# Patient Record
Sex: Male | Born: 1965 | Race: White | Hispanic: No | Marital: Married | Smoking: Never smoker
Health system: Southern US, Community
[De-identification: ages and names within clinical notes are randomized; demographics above are authoritative.]

## PROBLEM LIST (undated history)

## (undated) DIAGNOSIS — I10 Essential (primary) hypertension: Secondary | ICD-10-CM

## (undated) DIAGNOSIS — E7800 Pure hypercholesterolemia, unspecified: Secondary | ICD-10-CM

## (undated) DIAGNOSIS — M353 Polymyalgia rheumatica: Secondary | ICD-10-CM

## (undated) HISTORY — PX: SHOULDER SURGERY: SHX246

## (undated) HISTORY — DX: Polymyalgia rheumatica: M35.3

---

## 2015-09-17 ENCOUNTER — Emergency Department (HOSPITAL_BASED_OUTPATIENT_CLINIC_OR_DEPARTMENT_OTHER)
Admission: EM | Admit: 2015-09-17 | Discharge: 2015-09-17 | Disposition: A | Discharge status: Home / Self Care | Payer: No Typology Code available for payment source | Attending: Emergency Medicine | Admitting: Emergency Medicine

## 2015-09-17 ENCOUNTER — Encounter (HOSPITAL_BASED_OUTPATIENT_CLINIC_OR_DEPARTMENT_OTHER): Payer: Self-pay

## 2015-09-17 DIAGNOSIS — R1110 Vomiting, unspecified: Secondary | ICD-10-CM | POA: Diagnosis present

## 2015-09-17 DIAGNOSIS — Z79899 Other long term (current) drug therapy: Secondary | ICD-10-CM | POA: Insufficient documentation

## 2015-09-17 DIAGNOSIS — K529 Noninfective gastroenteritis and colitis, unspecified: Secondary | ICD-10-CM | POA: Insufficient documentation

## 2015-09-17 DIAGNOSIS — I10 Essential (primary) hypertension: Secondary | ICD-10-CM | POA: Insufficient documentation

## 2015-09-17 DIAGNOSIS — E7800 Pure hypercholesterolemia, unspecified: Secondary | ICD-10-CM | POA: Insufficient documentation

## 2015-09-17 HISTORY — DX: Essential (primary) hypertension: I10

## 2015-09-17 HISTORY — DX: Pure hypercholesterolemia, unspecified: E78.00

## 2015-09-17 LAB — CBC WITH DIFFERENTIAL/PLATELET
BASOS ABS: 0 10*3/uL (ref 0.0–0.1)
BASOS PCT: 0 %
EOS ABS: 0.1 10*3/uL (ref 0.0–0.7)
Eosinophils Relative: 1 %
HCT: 46.5 % (ref 39.0–52.0)
HEMOGLOBIN: 16.6 g/dL (ref 13.0–17.0)
Lymphocytes Relative: 7 %
Lymphs Abs: 0.8 10*3/uL (ref 0.7–4.0)
MCH: 30.2 pg (ref 26.0–34.0)
MCHC: 35.7 g/dL (ref 30.0–36.0)
MCV: 84.7 fL (ref 78.0–100.0)
MONOS PCT: 5 %
Monocytes Absolute: 0.5 10*3/uL (ref 0.1–1.0)
NEUTROS PCT: 87 %
Neutro Abs: 9.4 10*3/uL — ABNORMAL HIGH (ref 1.7–7.7)
Platelets: 290 10*3/uL (ref 150–400)
RBC: 5.49 MIL/uL (ref 4.22–5.81)
RDW: 12.1 % (ref 11.5–15.5)
WBC: 10.7 10*3/uL — AB (ref 4.0–10.5)

## 2015-09-17 LAB — BASIC METABOLIC PANEL
ANION GAP: 11 (ref 5–15)
BUN: 25 mg/dL — ABNORMAL HIGH (ref 6–20)
CALCIUM: 9.4 mg/dL (ref 8.9–10.3)
CO2: 26 mmol/L (ref 22–32)
CREATININE: 1.16 mg/dL (ref 0.61–1.24)
Chloride: 100 mmol/L — ABNORMAL LOW (ref 101–111)
Glucose, Bld: 120 mg/dL — ABNORMAL HIGH (ref 65–99)
Potassium: 3.7 mmol/L (ref 3.5–5.1)
SODIUM: 137 mmol/L (ref 135–145)

## 2015-09-17 MED ORDER — ONDANSETRON HCL 4 MG/2ML IJ SOLN
4.0000 mg | Freq: Once | INTRAMUSCULAR | Status: AC
  Administered 2015-09-17: 4 mg via INTRAVENOUS

## 2015-09-17 MED ORDER — ONDANSETRON 4 MG PO TBDP: 4.0000 mg | ORAL_TABLET | Freq: Three times a day (TID) | ORAL | Status: AC | PRN

## 2015-09-17 MED ORDER — SODIUM CHLORIDE 0.9 % IV BOLUS (SEPSIS)
1000.0000 mL | Freq: Once | INTRAVENOUS | Status: AC
  Administered 2015-09-17: 1000 mL via INTRAVENOUS

## 2015-09-17 MED ORDER — ONDANSETRON HCL 4 MG/2ML IJ SOLN
INTRAMUSCULAR | Status: AC
  Filled 2015-09-17: qty 2

## 2015-09-17 MED ORDER — ONDANSETRON HCL 4 MG/2ML IJ SOLN
4.0000 mg | Freq: Once | INTRAMUSCULAR | Status: AC
  Administered 2015-09-17: 4 mg via INTRAVENOUS
  Filled 2015-09-17: qty 2

## 2015-09-17 NOTE — ED Notes (Signed)
Pt states vomiting and diarrhea after eating at the [employer]; his girlfriend is a patient also with same c/o

## 2015-09-17 NOTE — ED Notes (Signed)
Pt c/o n/v/d after eating last pm

## 2015-09-17 NOTE — ED Provider Notes (Signed)
CSN: [id number]     Arrival date & time 09/17/15  0048 History   First MD Initiated Contact with Patient 09/17/15 0210     Chief Complaint  Patient presents with  . Emesis     (Consider location/radiation/quality/duration/timing/severity/associated sxs/prior Treatment) HPI Comments: 49yo M w/ HTN and HLD who p/w vomiting and diarrhea. Just prior to arrival, the patient and his girlfriend had a sudden onset of vomiting and non-bloody diarrhea. They both ate at a restaurant earlier tonight and shared shrimp salad. He has had crampy abdominal pain but no severe abdominal pain currently. No fevers. No cough/cold symptoms or recent illness. No sick contacts except for his girlfriend.  Patient is a 49 y.o. male presenting with vomiting. The history is provided by the patient.  Emesis   Past Medical History  Diagnosis Date  . Hypertension   . High cholesterol    History reviewed. No pertinent past surgical history. No family history on file. Social History  Substance Use Topics  . Smoking status: Never Smoker   . Smokeless tobacco: None  . Alcohol Use: Yes     Comment: social    Review of Systems  Gastrointestinal: Positive for vomiting.    10 Systems reviewed and are negative for acute change except as noted in the HPI.   Allergies  Review of patient's allergies indicates no known allergies.  Home Medications   Prior to Admission medications   Medication Sig Start Date End Date Taking? Authorizing Provider  hydrochlorothiazide (HYDRODIURIL) 12.5 MG tablet Take 12.5 mg by mouth daily.   Yes Historical Provider, MD  simvastatin (ZOCOR) 20 MG tablet Take 20 mg by mouth daily.   Yes Historical Provider, MD  ondansetron (ZOFRAN ODT) 4 MG disintegrating tablet Take 1 tablet (4 mg total) by mouth every 8 (eight) hours as needed for nausea or vomiting. 09/17/15   Ambrose Finlandachel Morgan Little, MD   BP 119/77 mmHg  Pulse 114  Temp(Src) 99.1 F (37.3 C) (Oral)  Resp 18  SpO2 98% Physical  Exam  Constitutional: He is oriented to person, place, and time. He appears well-developed and well-nourished. No distress.  Uncomfortable, holding emesis bag  HENT:  Head: Normocephalic and atraumatic.  Moist mucous membranes  Eyes: Conjunctivae are normal. Pupils are equal, round, and reactive to light.  Neck: Neck supple.  Cardiovascular: Normal rate, regular rhythm and normal heart sounds.   No murmur heard. Pulmonary/Chest: Effort normal and breath sounds normal.  Abdominal: Soft. Bowel sounds are normal. He exhibits no distension. There is no tenderness.  Musculoskeletal: He exhibits no edema.  Neurological: He is alert and oriented to person, place, and time.  Fluent speech  Skin: Skin is warm and dry.  Psychiatric: He has a normal mood and affect. Judgment normal.  Nursing note and vitals reviewed.   ED Course  Procedures (including critical care time) Labs Review Labs Reviewed  BASIC METABOLIC PANEL - Abnormal; Notable for the following:    Chloride 100 (*)    Glucose, Bld 120 (*)    BUN 25 (*)    All other components within normal limits  CBC WITH DIFFERENTIAL/PLATELET - Abnormal; Notable for the following:    WBC 10.7 (*)    Neutro Abs 9.4 (*)    All other components within normal limits    Imaging Review No results found. I have personally reviewed and evaluated these lab results as part of my medical decision-making.   EKG Interpretation None     Medications  sodium chloride  0.9 % bolus 1,000 mL (0 mLs Intravenous Stopped 09/17/15 0230)  ondansetron (ZOFRAN) injection 4 mg (4 mg Intravenous Given 09/17/15 0055)  sodium chloride 0.9 % bolus 1,000 mL (0 mLs Intravenous Stopped 09/17/15 0340)  ondansetron (ZOFRAN) injection 4 mg (4 mg Intravenous Given 09/17/15 0229)    MDM   Final diagnoses:  Gastroenteritis   49 year old male who presents with sudden onset of vomiting and diarrhea tonight. His girlfriend was brought in for the same symptoms. Patient  uncomfortable but nontoxic in appearance. No abdominal tenderness on exam. Vital signs stable. Basic labs showed WBC 10.7, otherwise unremarkable. Gave the patient an IV fluid bolus 2 and Zofran after which he felt much improved. Reviewed supportive care instructions and provided with Zofran to use as needed at home. The patient denies any abdominal pain and given that his symptoms are improved, I do not feel that he needs any abdominal imaging at this time. Return precautions reviewed and patient discharged in satisfactory condition.  Laurence Spates, MD 09/17/15 [phone]

## 2015-10-28 NOTE — ED Notes (Signed)
Patient called stating that he needs from the date of service stating that he and his girlfriend were seen in the emergency department for a viral gastroenteritis after eating in a local restaurant, and would not be able to travel for three or four days after being treated.  Chart reviewed with Dr. Felix PaciniJacobuwitz and ok given to write note.

## 2019-01-11 ENCOUNTER — Encounter (INDEPENDENT_AMBULATORY_CARE_PROVIDER_SITE_OTHER): Payer: Self-pay

## 2019-01-11 ENCOUNTER — Encounter (INDEPENDENT_AMBULATORY_CARE_PROVIDER_SITE_OTHER): Payer: Self-pay | Admitting: Orthopaedic Surgery

## 2019-01-11 ENCOUNTER — Ambulatory Visit (INDEPENDENT_AMBULATORY_CARE_PROVIDER_SITE_OTHER): Payer: Self-pay | Admitting: Orthopaedic Surgery

## 2019-01-11 VITALS — BP 146/106 | HR 61 | Ht 66.0 in | Wt 165.0 lb

## 2019-01-11 DIAGNOSIS — M25512 Pain in left shoulder: Secondary | ICD-10-CM

## 2019-01-11 NOTE — Progress Notes (Signed)
Office Visit Note   Patient: Kyle Mckinney           Date of Birth: 10/05/1966           MRN: [id number] Visit Date: 01/11/2019              Requested by: No referring provider defined for this encounter. PCP: System, Pcp Not In   Assessment & Plan: Visit Diagnoses:  1. Acute pain of left shoulder     Plan: Recent onset of left shoulder pain over the last several weeks he thinks probably related to playing pickle ball.  He has developed some ecchymosis in the mid arm in the area of the biceps.  Appearance appears to be consistent with a rupture of the long head of the biceps tendon.  Is not having any impingement.  Hopefully this is just an isolated rupture of the biceps tendon and will slowly resolve over time.  If no improvement over the next 3 to 4 weeks would suggest an MRI scan.  Activity modification until he is feeling better. Also has been experiencing some pain of his Achilles tendons when he runs as he does up to 3 times a week.  No obvious change by exam.  Have discussed cross training and good comfortable shoes  Follow-Up Instructions: Return if symptoms worsen or fail to improve.   Orders:  No orders of the defined types were placed in this encounter.  No orders of the defined types were placed in this encounter.     Procedures: No procedures performed   Clinical Data: No additional findings.   Subjective: Chief Complaint  Patient presents with  . Left Shoulder - Pain  Patient presents today with left shoulder pain X 28months. No known injury. Patient states that he has pain that radiates into his biceps. He has a large knot and bruise on his biceps area. He is taking prednisone for PMR, which he was diagnosed with at the same time the pain started. He has had no previous x-rays. Kyle Mckinney experienced rather cute onset of left dominant shoulder pain within the last several weeks he believes after playing pickle ball.  Several days he developed some ecchymosis in  the mid arm associated with change in the appearance of the biceps tendon.  He does have a history of polymyalgia rheumatica and is presently taking 30 mg of prednisone daily  HPI  Review of Systems   Objective: Vital Signs: BP (!) 146/106   Pulse 61   Ht 5\' 6"  (1.676 m)   Wt 165 lb (74.8 kg)   BMI 26.63 kg/m   Physical Exam Constitutional:      Appearance: He is well-developed.  Eyes:     Pupils: Pupils are equal, round, and reactive to light.  Pulmonary:     Effort: Pulmonary effort is normal.  Skin:    General: Skin is warm and dry.  Neurological:     Mental Status: He is alert and oriented to person, place, and time.  Psychiatric:        Behavior: Behavior normal.     Ortho Exam awake alert and oriented x3.  No acute distress.  Obvious rupture of the long head of the biceps of the left dominant upper extremity.  The biceps muscles in the inferior position and there is some areas of ecchymosis.  Very minimal tenderness.  Negative impingement.  Negative empty can testing no pain about the anterior or lateral aspect of the shoulder.  Good grip  and good release. Also has been experiencing some discomfort in the area of his Achilles tendons.  The tendons are intact.  There is and there were no masses or evidence of a partial tear.  No calf pain  Specialty Comments:  No specialty comments available.  Imaging: No results found.   PMFS History: There are no active problems to display for this patient.  Past Medical History:  Diagnosis Date  . High cholesterol   . Hypertension   . PMR (polymyalgia rheumatica) (HCC)     History reviewed. No pertinent family history.  Past Surgical History:  Procedure Laterality Date  . SHOULDER SURGERY     Social History   Occupational History  . Not on file  Tobacco Use  . Smoking status: Never Smoker  Substance and Sexual Activity  . Alcohol use: Yes    Comment: social  . Drug use: Never  . Sexual activity: Not on file

## 2019-07-17 ENCOUNTER — Telehealth: Payer: Self-pay | Admitting: Orthopaedic Surgery

## 2019-07-17 NOTE — Telephone Encounter (Signed)
Patient called stating Dr. Durward Fortes told him to call back if he wanted to move forward with surgery for his left torn bicep. Patient is requesting a return call stating he has questions regarding if an MRI is required and if would be an out-patient procedure.

## 2019-07-17 NOTE — Telephone Encounter (Signed)
Please see below.

## 2019-07-18 ENCOUNTER — Telehealth: Payer: Self-pay | Admitting: Orthopaedic Surgery

## 2019-07-18 ENCOUNTER — Other Ambulatory Visit: Payer: Self-pay | Admitting: Orthopaedic Surgery

## 2019-07-18 DIAGNOSIS — M25512 Pain in left shoulder: Secondary | ICD-10-CM

## 2019-07-18 DIAGNOSIS — G8929 Other chronic pain: Secondary | ICD-10-CM

## 2019-07-18 NOTE — Telephone Encounter (Signed)
Spoke with patient. He is aware that it is without contrast.

## 2019-07-18 NOTE — Telephone Encounter (Signed)
Called patient. No answer. Left message that Dr.Whitfield wants to get an MRI of his left shoulder first. Following the MRI he can come into the office to discuss results and surgical options.

## 2019-07-18 NOTE — Telephone Encounter (Signed)
Will need an MRI of the left shoulder-then I can be more specific re what if any surgery would be necessary-would be out patient

## 2019-07-18 NOTE — Telephone Encounter (Signed)
Patient called stating he was returning your call.  Patient states he has [employer] which has a high deductible.  Patient states he called [facility] to get an idea of the cost of an MRI and they wanted to know if Dr. Durward Fortes would be ordering an MRI with or without contrast.

## 2019-07-29 ENCOUNTER — Telehealth: Payer: Self-pay | Admitting: Orthopaedic Surgery

## 2019-07-29 NOTE — Telephone Encounter (Signed)
Patient called stating he is scheduled for an MRI on 08/04/19.  Patient is requesting to receive the results over the phone due to not having insurance.

## 2019-08-01 NOTE — Telephone Encounter (Signed)
FYI

## 2019-08-01 NOTE — Telephone Encounter (Signed)
thanks

## 2019-08-04 ENCOUNTER — Other Ambulatory Visit: Payer: Self-pay

## 2019-08-04 ENCOUNTER — Ambulatory Visit
Admission: RE | Admit: 2019-08-04 | Discharge: 2019-08-04 | Disposition: A | Discharge status: Home / Self Care | Payer: No Typology Code available for payment source | Source: Ambulatory Visit | Attending: Orthopaedic Surgery | Admitting: Orthopaedic Surgery

## 2019-08-04 DIAGNOSIS — M25512 Pain in left shoulder: Secondary | ICD-10-CM

## 2019-08-04 DIAGNOSIS — G8929 Other chronic pain: Secondary | ICD-10-CM

## 2019-08-04 IMAGING — MR MR SHOULDER*L* W/O CM
5 series · 40 of 40 positions shown · non-contrast
Comparison: None.

CLINICAL DATA: Shoulder pain, torn biceps tendon 7 months ago.

EXAM:
MRI OF THE LEFT SHOULDER WITHOUT CONTRAST
TECHNIQUE: Multiplanar, multisequence MR imaging of the shoulder was performed.
No intravenous contrast was administered.

[Series 3: PD fat-sat · axial · 4.0mm · 0.27mm/px · z∈[-51,+39]mm · 8 of 20 slices shown (1 of 2)]
[im 1/20]
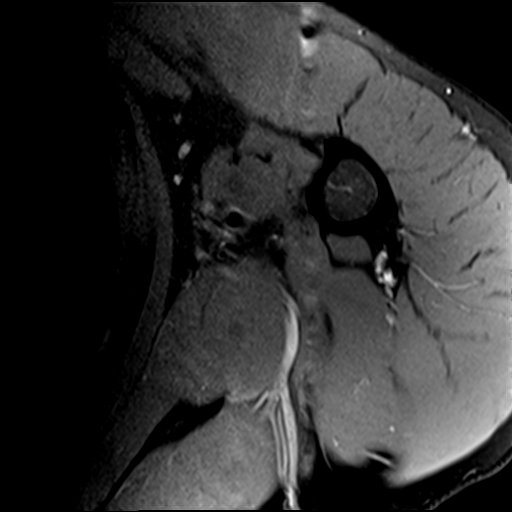
[im 3/20]
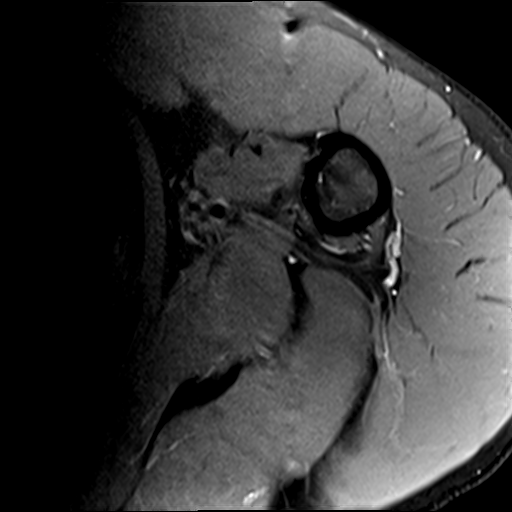
[im 6/20]
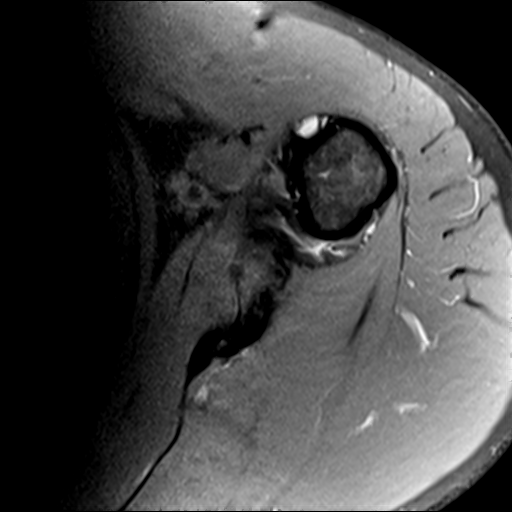
[im 9/20]
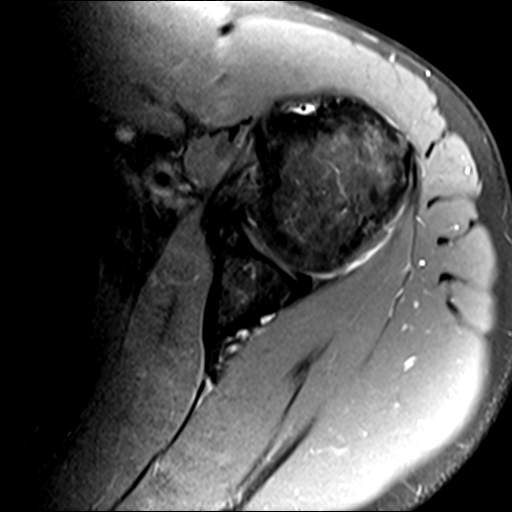
[im 11/20]
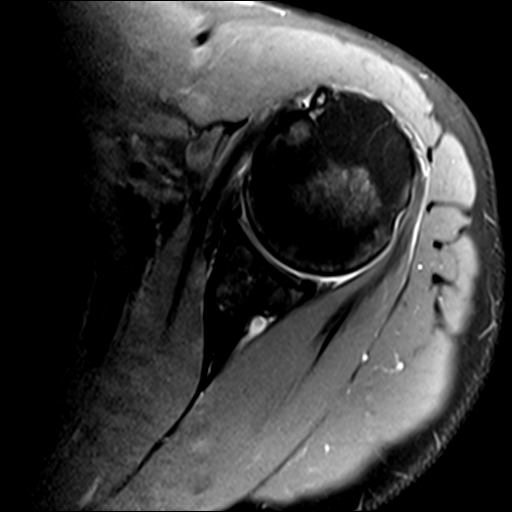
[im 14/20]
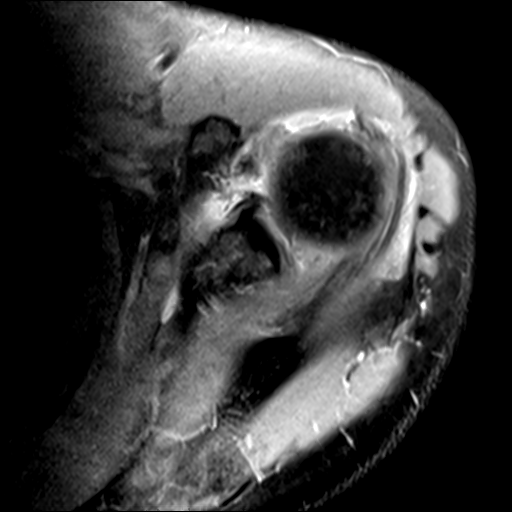
[im 17/20]
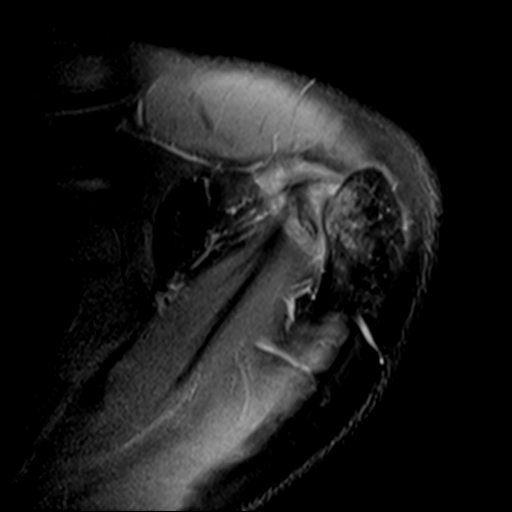
[im 20/20]
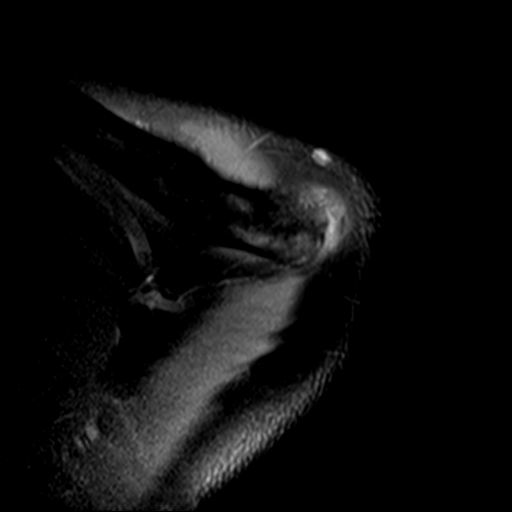

[Series 6: T2 fat-sat · oblique · 4.0mm · 0.55mm/px · 8 of 20 slices shown (1 of 2)]
[im 1/20]
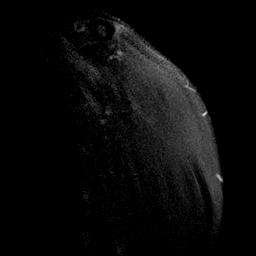
[im 3/20]
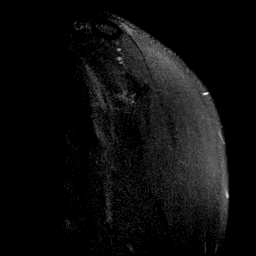
[im 6/20]
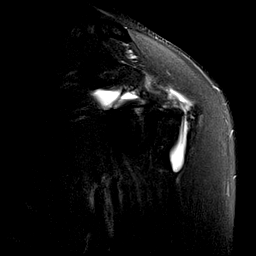
[im 9/20]
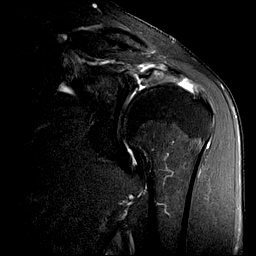
[im 11/20]
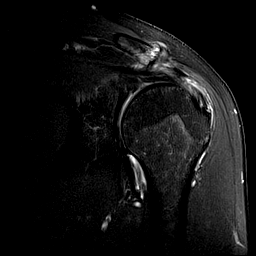
[im 14/20]
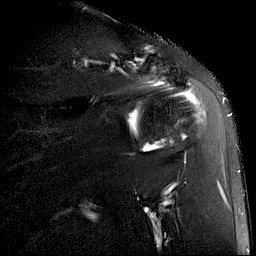
[im 17/20]
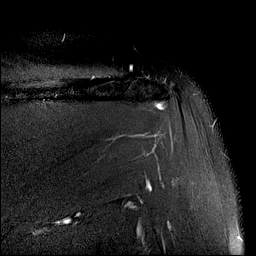
[im 20/20]
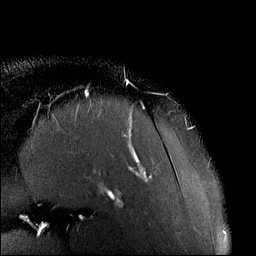

[Series 7: PD fat-sat · oblique · 4.0mm · 0.55mm/px · 8 of 20 slices shown (2 of 2)]
[im 1/20]
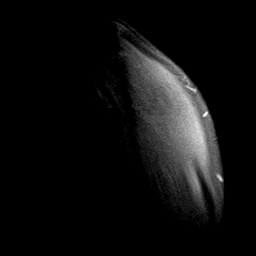
[im 3/20]
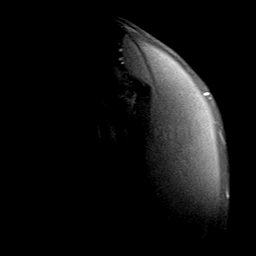
[im 6/20]
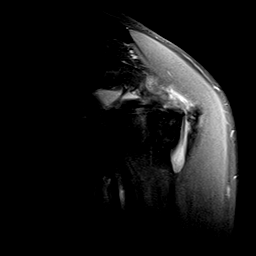
[im 9/20]
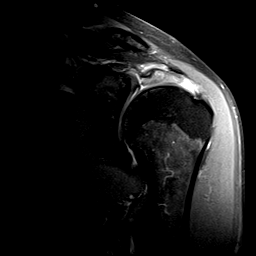
[im 11/20]
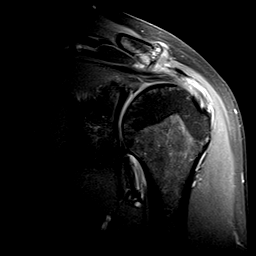
[im 14/20]
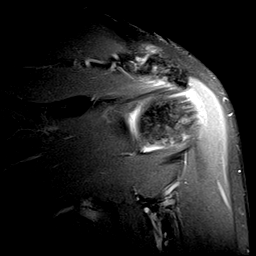
[im 17/20]
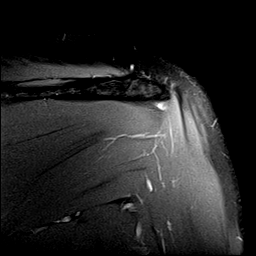
[im 20/20]
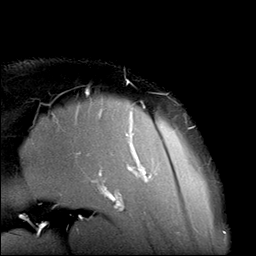

[Series 8: T2 fat-sat · oblique · 4.0mm · 0.59mm/px · 8 of 20 slices shown (2 of 2)]
[im 1/20]
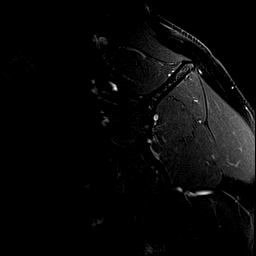
[im 3/20]
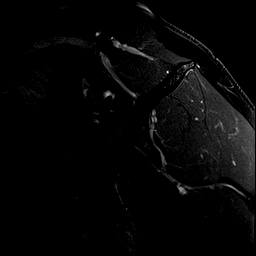
[im 6/20]
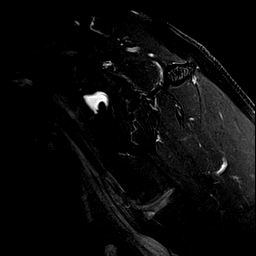
[im 9/20]
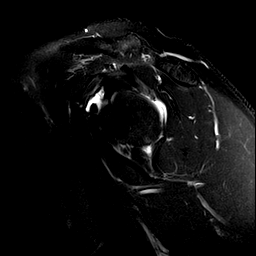
[im 11/20]
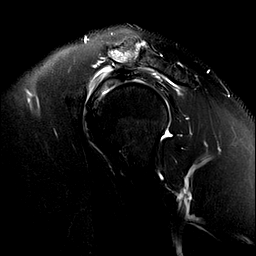
[im 14/20]
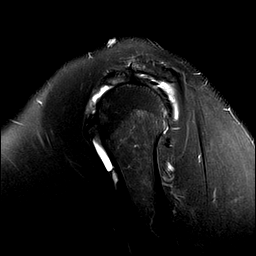
[im 17/20]
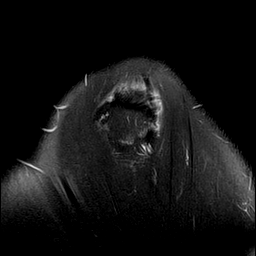
[im 20/20]
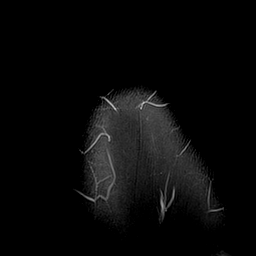

[Series 9: T1 · oblique · 4.0mm · 0.59mm/px · 8 of 20 slices shown]
[im 1/20]
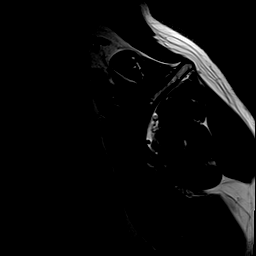
[im 3/20]
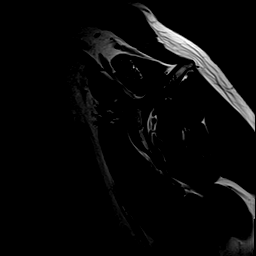
[im 6/20]
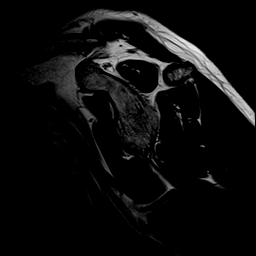
[im 9/20]
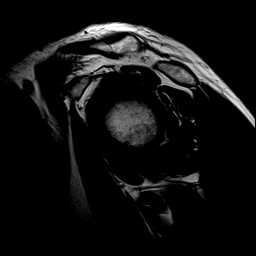
[im 11/20]
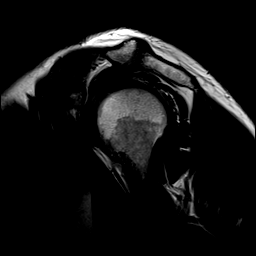
[im 14/20]
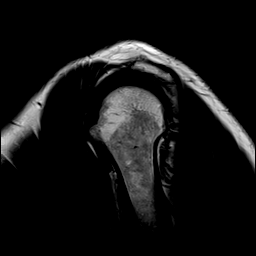
[im 17/20]
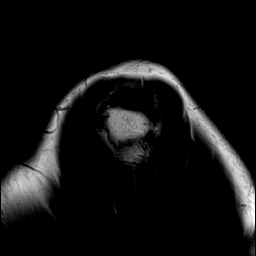
[im 20/20]
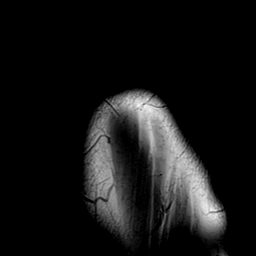

[40 of 40 positions shown; findings below may reference images not displayed]

FINDINGS: Rotator cuff: Severe tendinosis of the supraspinatus tendon with a
large full-thickness tear of the supraspinatus tendon measuring 17
mm in anterior-posterior dimension and with 9 mm of retraction.
Moderate tendinosis of the infraspinatus tendon. Teres minor tendon
is intact. Subscapularis tendon is intact.

Muscles: No atrophy or fatty replacement of nor abnormal signal
within, the muscles of the rotator cuff.

Biceps long head: Complete chronic tear of the long head of the
biceps tendon.

Acromioclavicular Joint: Severe arthropathy of the acromioclavicular
joint. Type II acromion. Small amount of subacromial/subdeltoid
bursal fluid.

Glenohumeral Joint: No joint effusion. No chondral defect.

Labrum: Limited evaluation secondary lack of intra-articular fluid.
Small posterior labral tear. Degeneration and blunting of the
superior labrum likely reflecting chronic tear.

Bones:  No marrow abnormality, fracture or dislocation.

Other: No fluid collection or hematoma.
IMPRESSION: 1. Severe tendinosis of the supraspinatus tendon with a large
full-thickness tear of the supraspinatus tendon measuring 17 mm in
anterior-posterior dimension and with 9 mm of retraction.
2. Moderate tendinosis of the infraspinatus tendon.
3. Complete chronic tear of the long head of the biceps tendon.

## 2019-08-06 NOTE — Telephone Encounter (Signed)
Called with results.

## 2019-08-06 NOTE — Telephone Encounter (Signed)
I have placed the MRI on your desk.

## 2019-08-09 ENCOUNTER — Telehealth: Payer: Self-pay | Admitting: Orthopaedic Surgery

## 2019-08-09 NOTE — Telephone Encounter (Signed)
Patient called stating he has additional questions regarding his MRI and requested a return call.

## 2019-08-09 NOTE — Telephone Encounter (Signed)
Patient has additional questions about MRI results, surgery, PT and rehab.

## 2019-08-12 ENCOUNTER — Telehealth: Payer: Self-pay | Admitting: Orthopaedic Surgery

## 2019-08-12 NOTE — Telephone Encounter (Signed)
Patient called about scheduling his shoulder surgery for the first week in November.  He would like to get an estimate on the cost, but in order to do this I will need the surgery sheet that indicates the location and has the cpt codes.  Also the patient has questions regarding recover and physical therapy.    Pt's cb  [phone]

## 2019-08-13 NOTE — Telephone Encounter (Signed)
Called and discussed surgery in detail

## 2019-08-13 NOTE — Telephone Encounter (Signed)
Called-completed surgery sheet

## 2019-08-13 NOTE — Telephone Encounter (Signed)
The sheet is coming your way   :)

## 2019-08-13 NOTE — Telephone Encounter (Signed)
FYI.......did you fill out a surgical sheet?

## 2019-08-22 ENCOUNTER — Telehealth: Payer: Self-pay | Admitting: Orthopaedic Surgery

## 2019-08-22 NOTE — Telephone Encounter (Signed)
[facility] Clinical Review Board left a voicemail stating they approved Dr. Rudene Anda referral for patient to see Dr. Johna Roles.  If you have any questions, please call [phone]

## 2019-08-22 NOTE — Telephone Encounter (Signed)
thanks

## 2019-08-22 NOTE — Telephone Encounter (Signed)
FYI

## 2019-08-29 ENCOUNTER — Telehealth: Payer: Self-pay | Admitting: Orthopaedic Surgery

## 2019-08-29 NOTE — Telephone Encounter (Signed)
thanks

## 2019-08-29 NOTE — Telephone Encounter (Signed)
Patient called to say that he got a second opinion at [facility] and the surgeon there will be doing the procedure.    cb  [phone]

## 2019-09-25 ENCOUNTER — Inpatient Hospital Stay: Payer: Self-pay | Admitting: Orthopaedic Surgery

## 2019-10-03 ENCOUNTER — Telehealth: Payer: Self-pay | Admitting: Orthopaedic Surgery

## 2019-10-03 NOTE — Telephone Encounter (Signed)
Please see below.

## 2019-10-03 NOTE — Telephone Encounter (Signed)
Pt called in requesting the [place], CPT code, diagnostics code, and our EIN number for his last visit with dr.whitfield on 01/11/2019. Pt is requesting for someone to e-mail him this information to: [email or web address]   Any questions to give him a call [phone]

## 2021-02-17 ENCOUNTER — Ambulatory Visit
Admission: RE | Admit: 2021-02-17 | Discharge: 2021-02-17 | Disposition: A | Discharge status: Home / Self Care | Payer: No Typology Code available for payment source | Source: Ambulatory Visit | Attending: Internal Medicine | Admitting: Internal Medicine

## 2021-02-17 ENCOUNTER — Other Ambulatory Visit: Payer: Self-pay | Admitting: Internal Medicine

## 2021-02-17 DIAGNOSIS — M25562 Pain in left knee: Secondary | ICD-10-CM

## 2021-02-17 IMAGING — CR DG KNEE COMPLETE 4+V*L*
4 series · 4 of 4 positions shown · non-contrast
Comparison: None.

CLINICAL DATA: Left knee pain. Patient reports medial pain, worse
for the past year. No known injury.

EXAM:
LEFT KNEE - COMPLETE 4+ VIEW

[t knee ap left]
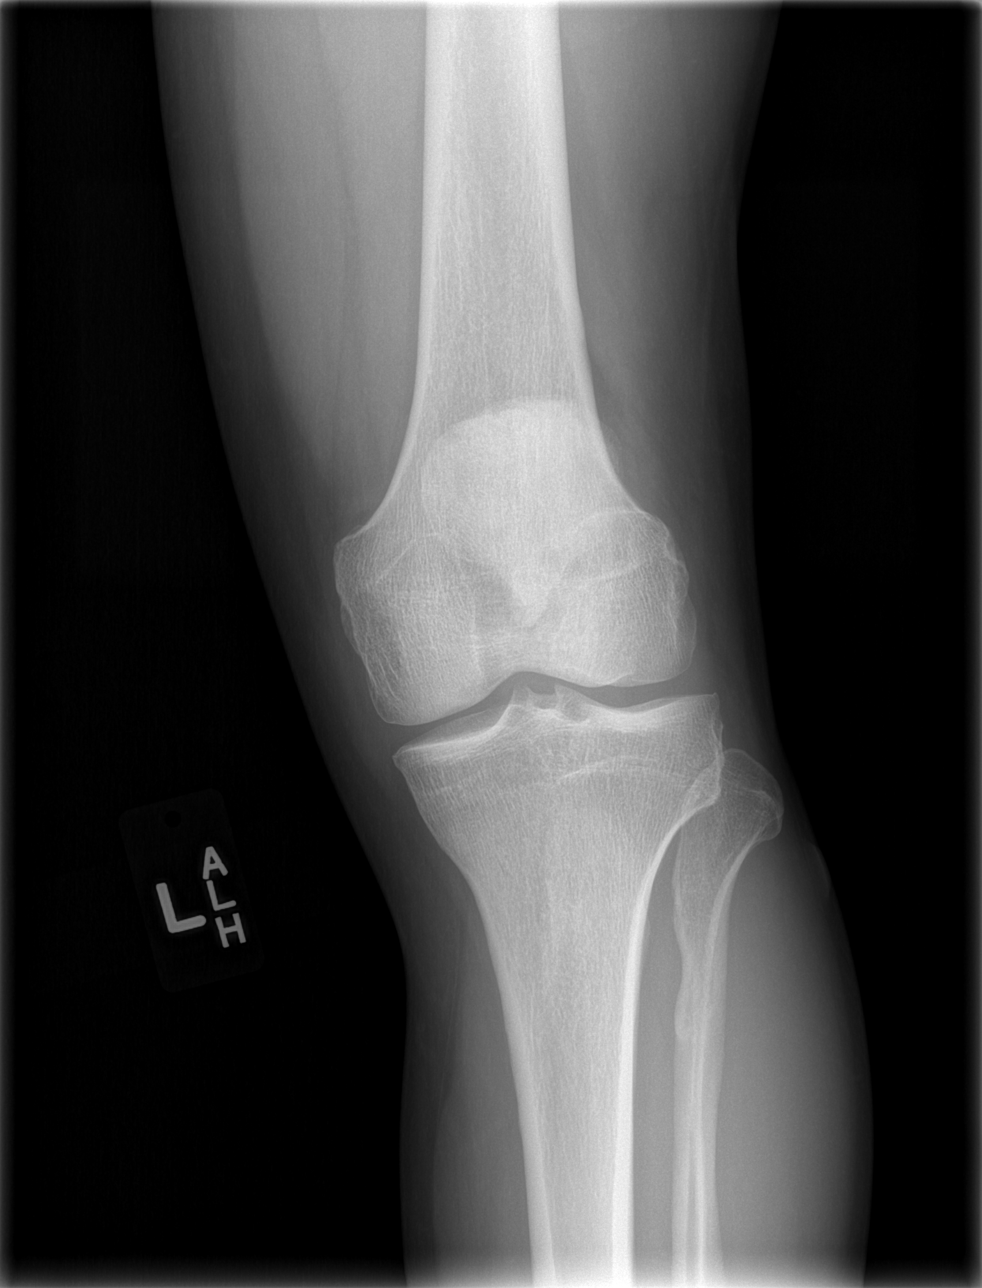

[t knee oblique left (1 of 2)]
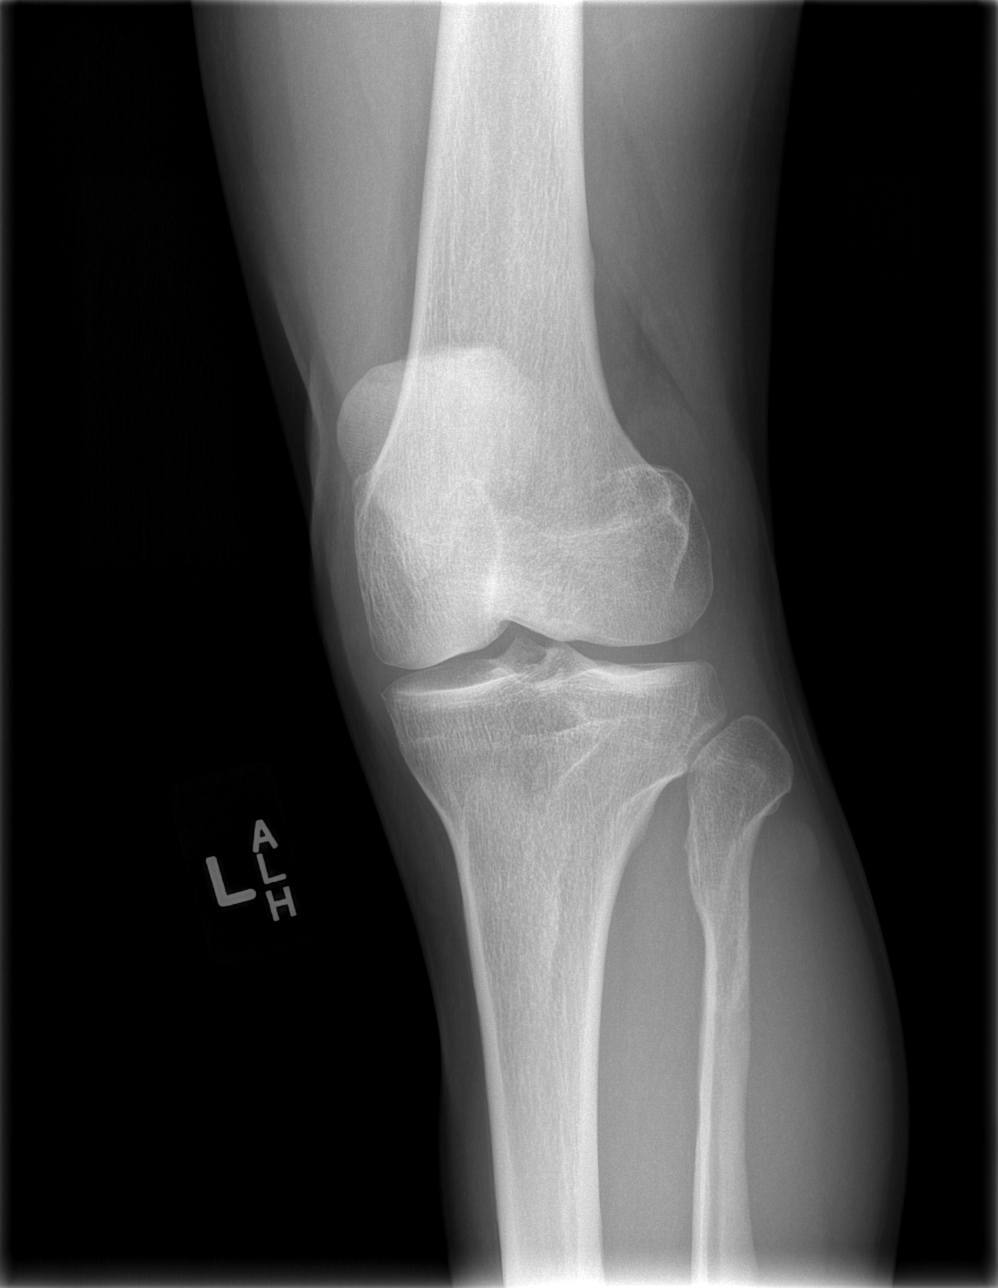

[t knee oblique left (2 of 2)]
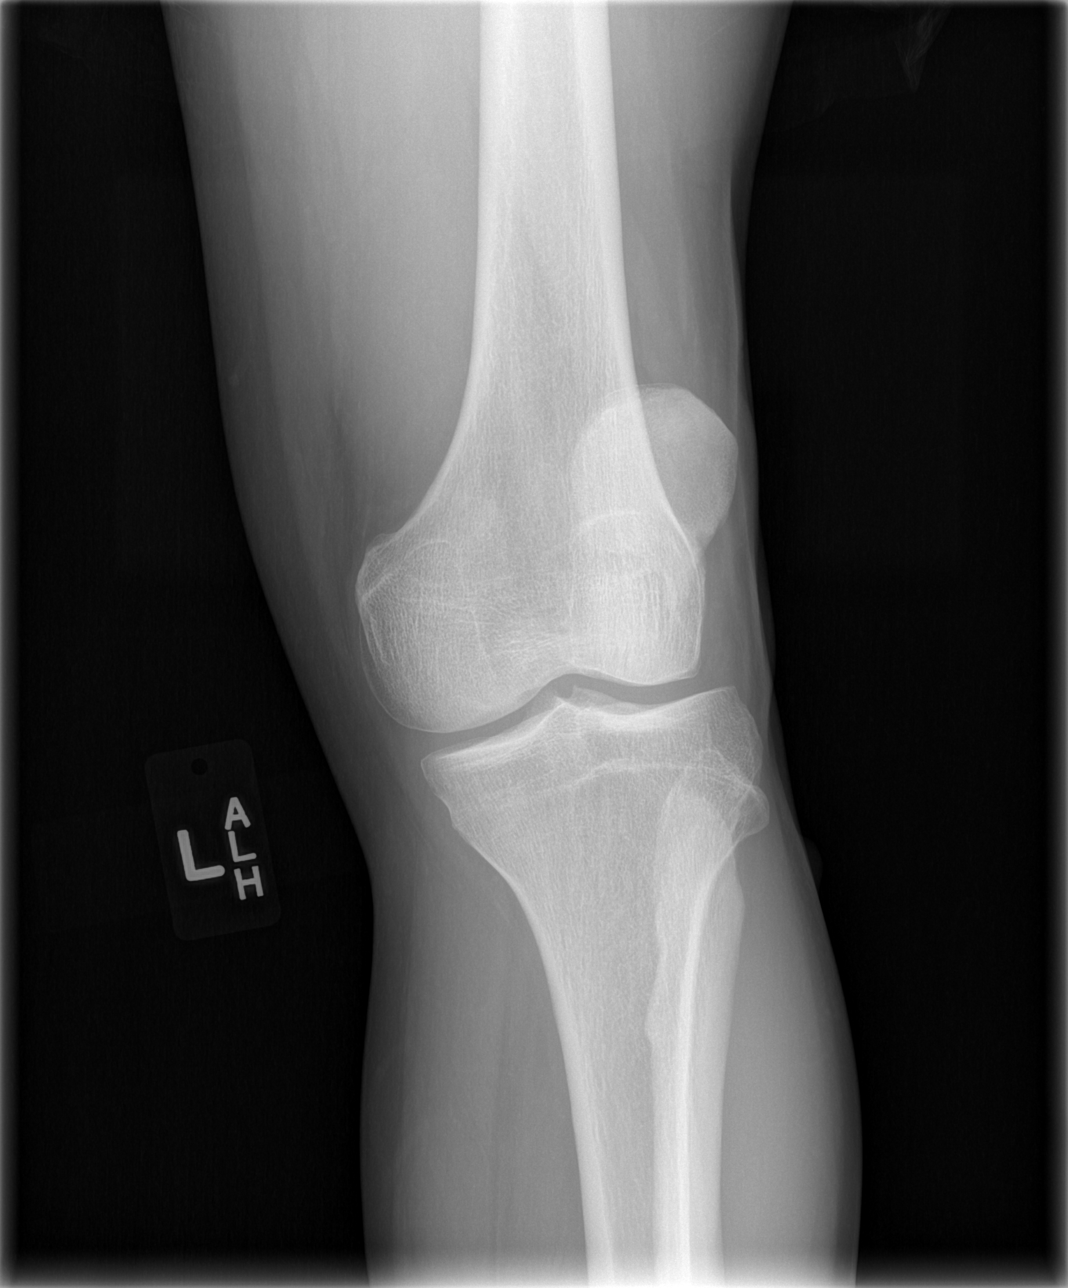

[t knee lat left]
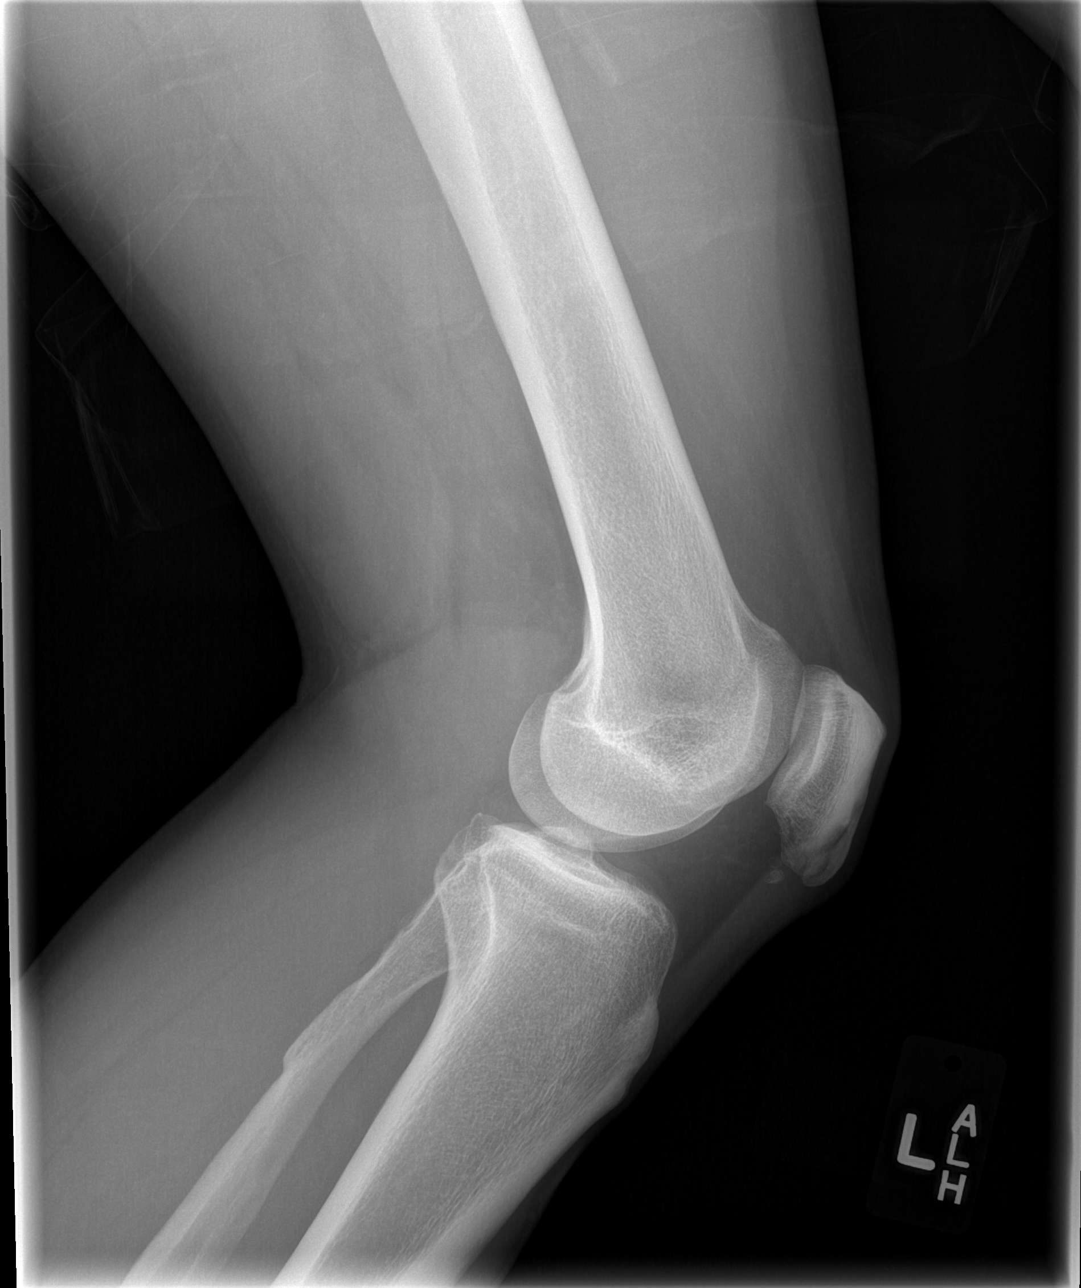

[4 of 4 positions shown; findings below may reference images not displayed]

FINDINGS: No evidence of fracture, dislocation, or joint effusion. Trace
peripheral spurring of the medial tibiofemoral and patellofemoral
compartment. There is a patellar tendon enthesophyte. Slight contour
deformity of the fibular neck suggests healed prior fracture. Soft
tissues are unremarkable.
IMPRESSION: Minimal osteoarthritis with patellar tendon enthesophyte.

## 2021-04-15 ENCOUNTER — Other Ambulatory Visit: Payer: Self-pay | Admitting: Orthopaedic Surgery

## 2021-04-15 DIAGNOSIS — G8929 Other chronic pain: Secondary | ICD-10-CM

## 2021-04-15 DIAGNOSIS — M25562 Pain in left knee: Secondary | ICD-10-CM

## 2021-04-25 ENCOUNTER — Other Ambulatory Visit: Payer: Self-pay

## 2021-04-28 ENCOUNTER — Other Ambulatory Visit: Payer: Self-pay

## 2021-06-28 ENCOUNTER — Ambulatory Visit
Admission: RE | Admit: 2021-06-28 | Discharge: 2021-06-28 | Disposition: A | Discharge status: Home / Self Care | Payer: No Typology Code available for payment source | Source: Ambulatory Visit | Attending: Orthopaedic Surgery | Admitting: Orthopaedic Surgery

## 2021-06-28 DIAGNOSIS — G8929 Other chronic pain: Secondary | ICD-10-CM

## 2021-06-28 DIAGNOSIS — M25562 Pain in left knee: Secondary | ICD-10-CM

## 2021-06-28 IMAGING — MR MR KNEE*L* W/O CM
6 series · 40 of 40 positions shown · non-contrast
Comparison: Radiographs [DATE]

CLINICAL DATA: Left knee pain for approximately 1 year. No specific
injury.

EXAM:
MRI OF THE LEFT KNEE WITHOUT CONTRAST
TECHNIQUE: Multiplanar, multisequence MR imaging of the knee was performed. No
intravenous contrast was administered.

[Series 5: T2 fat-sat · axial · left · 4.0mm · 0.50mm/px · z∈[-93,+60]mm · 9 of 36 slices shown (1 of 3)]
[im 1/36]
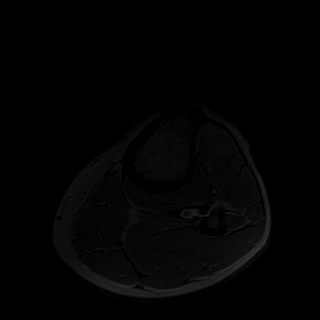
[im 5/36]
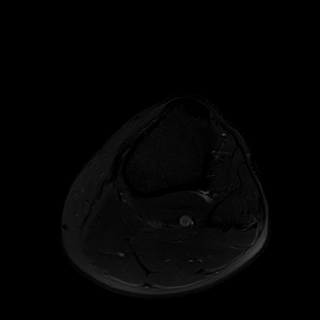
[im 9/36]
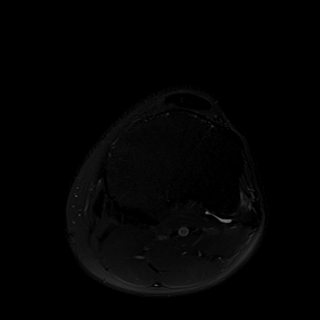
[im 14/36]
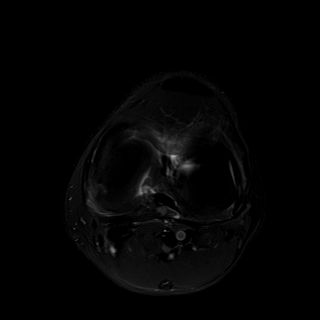
[im 18/36]
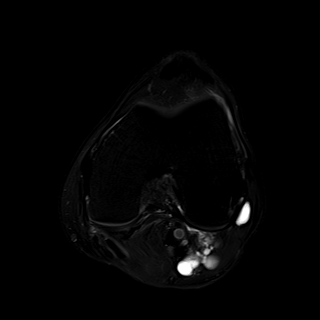
[im 22/36]
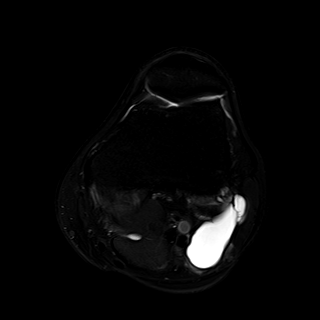
[im 27/36]
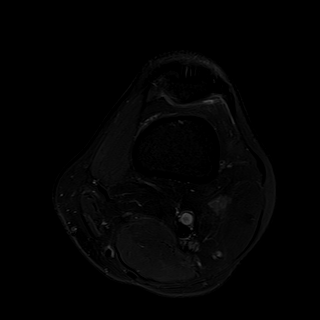
[im 31/36]
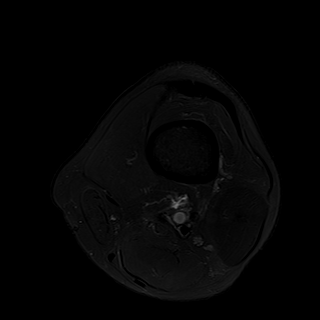
[im 36/36]
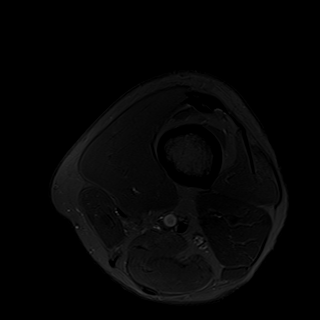

[Series 6: T2 fat-sat · coronal · left · 4.0mm · 0.47mm/px · 6 of 26 slices shown (2 of 3)]
[im 1/26]
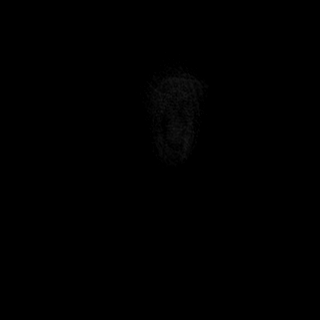
[im 6/26]
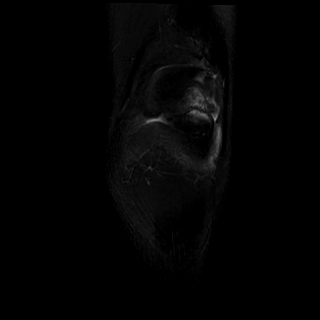
[im 11/26]
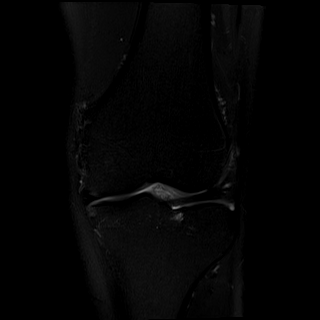
[im 16/26]
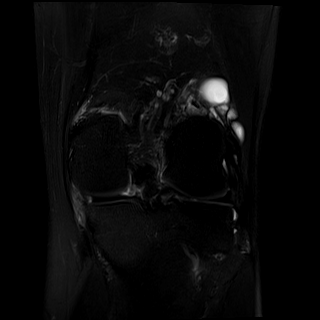
[im 21/26]
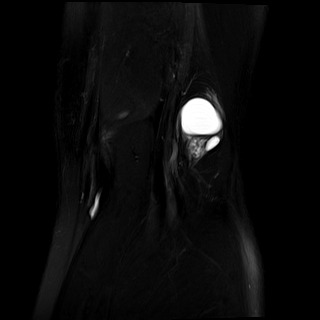
[im 26/26]
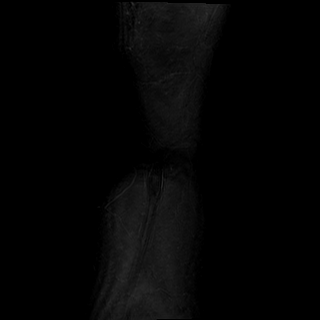

[Series 7: T1 · coronal · left · 4.0mm · 0.47mm/px · 6 of 26 slices shown]
[im 1/26]
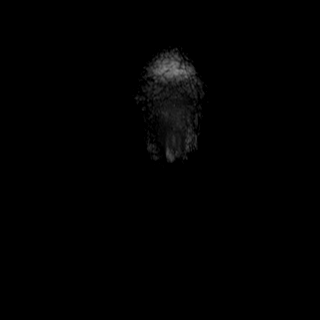
[im 6/26]
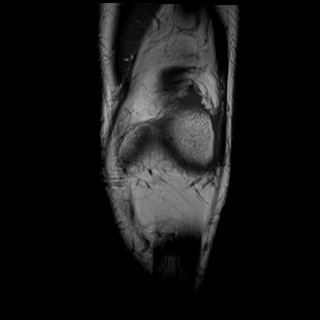
[im 11/26]
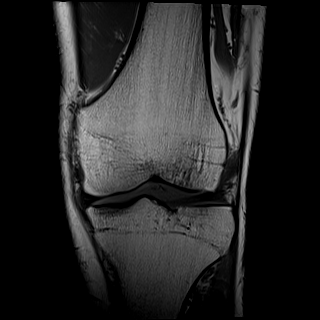
[im 16/26]
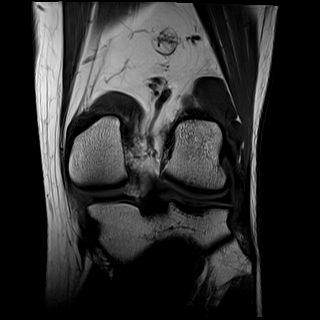
[im 21/26]
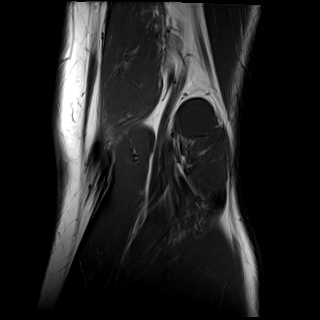
[im 26/26]
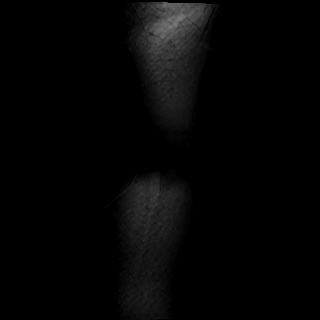

[Series 8: PD fat-sat · coronal · left · 3.0mm · 0.47mm/px · 7 of 28 slices shown (1 of 2)]
[im 1/28]
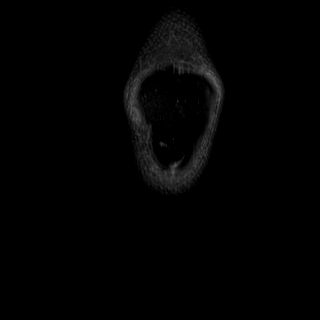
[im 5/28]
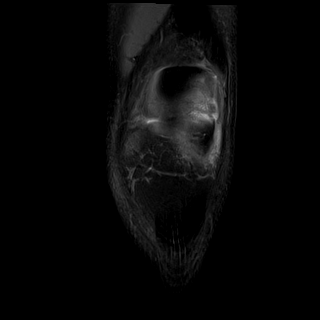
[im 10/28]
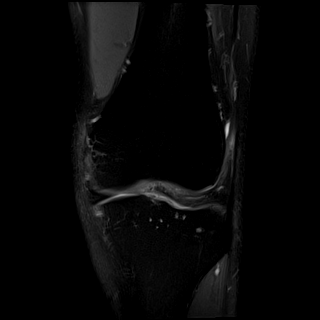
[im 14/28]
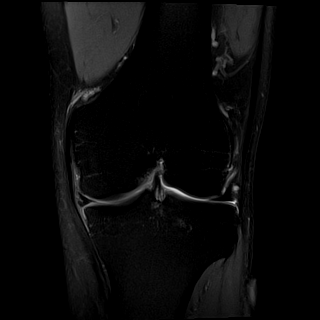
[im 19/28]
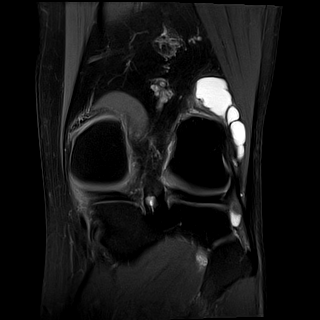
[im 23/28]
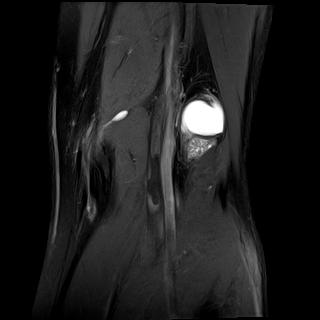
[im 28/28]
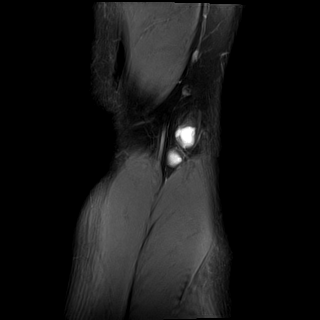

[Series 9: PD fat-sat · sagittal · left · 3.0mm · 0.39mm/px · 6 of 27 slices shown (2 of 2)]
[im 1/27]
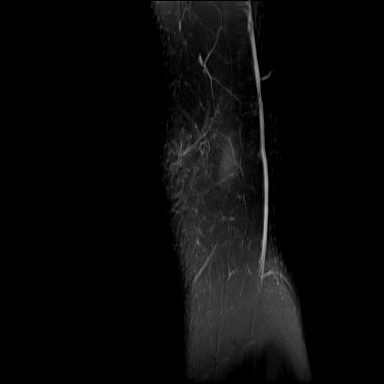
[im 6/27]
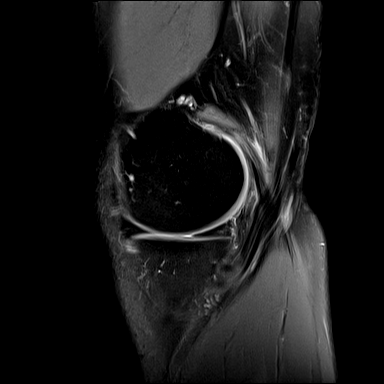
[im 11/27]
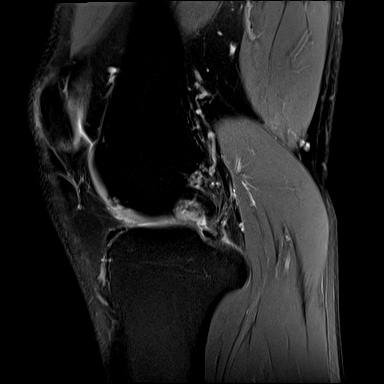
[im 16/27]
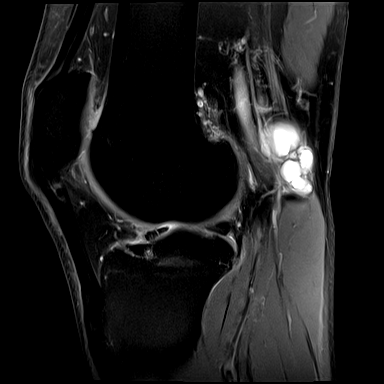
[im 21/27]
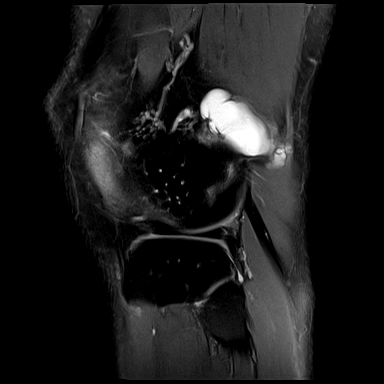
[im 27/27]
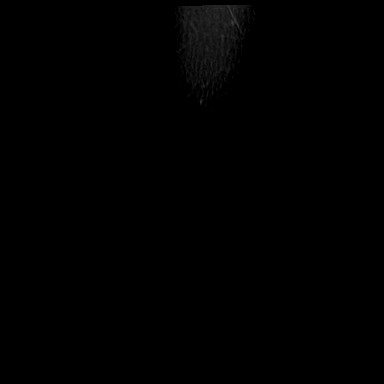

[Series 10: T2 fat-sat · sagittal · left · 3.0mm · 0.39mm/px · 6 of 27 slices shown (3 of 3)]
[im 1/27]
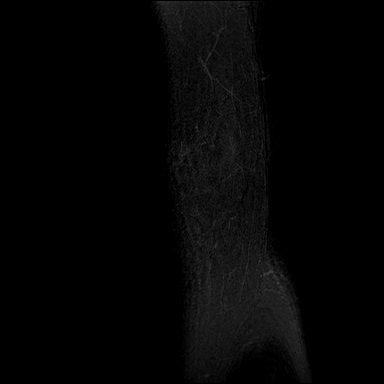
[im 6/27]
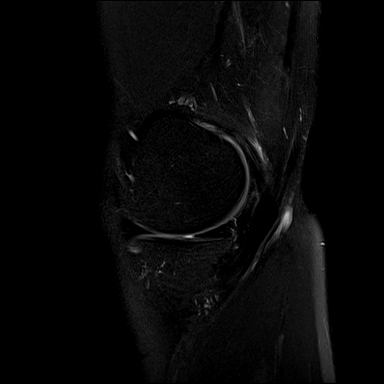
[im 11/27]
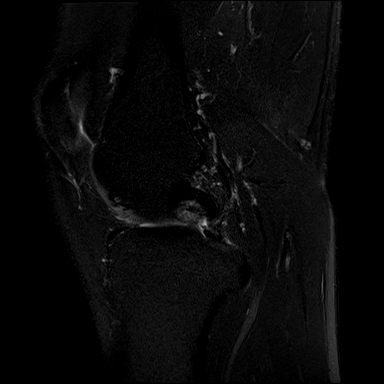
[im 16/27]
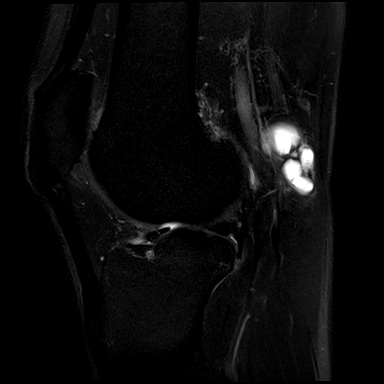
[im 21/27]
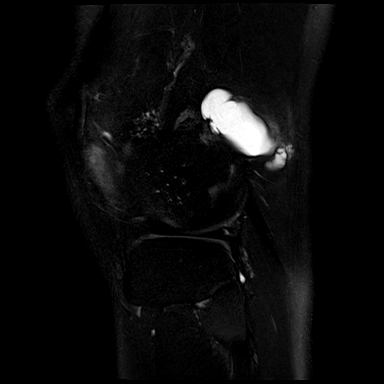
[im 27/27]
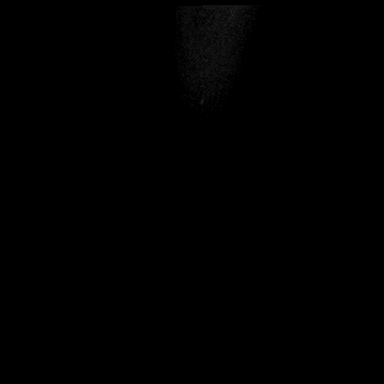

[40 of 40 positions shown; findings below may reference images not displayed]

FINDINGS: MENISCI

Medial meniscus: There is a E full-thickness radial tear involving
the posterior horn mid body junction region with some surrounding
inflammation/synovitis.

Lateral meniscus:  Intact

LIGAMENTS

Cruciates:  Intact

Collaterals:  Intact

CARTILAGE

Patellofemoral: Mild degenerative chondrosis mainly involving the
femoral trochlear cartilage.

Medial:  Mild degenerative chondrosis.

Lateral: Mild to moderate degenerative chondrosis with early joint
space narrowing.

Joint:  No joint effusion.

Popliteal Fossa:  No popliteal mass. Tiny Baker's cyst.

There is a large (5 cm) multi septated cystic lesion
posterolaterally in the epicondylar region. This is most likely a
benign ganglion cyst.

Extensor Mechanism: The patella retinacular structures are intact
and the quadriceps and patellar tendons are intact. Probable chronic
proximal patellar tendinopathy with small rounded calcifications
likely the sequela of BRAGA.

Bones: No acute bony findings. No bone lesions or osteochondral
abnormality.

Other: Unremarkable knee musculature.
IMPRESSION: 1. Full-thickness radial tear involving the posterior horn mid body
junction region of the medial meniscus.
2. Intact ligamentous structures and no acute bony findings.
3. Mild tricompartmental degenerative chondrosis.
4. Large (5 cm) multi septated cystic lesion posterolaterally in the
epicondylar region is most likely a benign ganglion cyst.

## 2022-10-13 ENCOUNTER — Other Ambulatory Visit: Payer: Self-pay | Admitting: Internal Medicine

## 2022-10-13 DIAGNOSIS — M25511 Pain in right shoulder: Secondary | ICD-10-CM

## 2022-10-25 ENCOUNTER — Ambulatory Visit
Admission: RE | Admit: 2022-10-25 | Discharge: 2022-10-25 | Disposition: A | Discharge status: Home / Self Care | Payer: Self-pay | Source: Ambulatory Visit | Attending: Internal Medicine | Admitting: Internal Medicine

## 2022-10-25 DIAGNOSIS — M25511 Pain in right shoulder: Secondary | ICD-10-CM

## 2022-12-22 ENCOUNTER — Other Ambulatory Visit: Payer: Self-pay | Admitting: Orthopaedic Surgery

## 2022-12-22 DIAGNOSIS — M7122 Synovial cyst of popliteal space [Baker], left knee: Secondary | ICD-10-CM

## 2023-01-05 ENCOUNTER — Ambulatory Visit
Admission: RE | Admit: 2023-01-05 | Discharge: 2023-01-05 | Disposition: A | Discharge status: Home / Self Care | Payer: No Typology Code available for payment source | Source: Ambulatory Visit | Attending: Orthopaedic Surgery | Admitting: Orthopaedic Surgery

## 2023-01-05 DIAGNOSIS — M7122 Synovial cyst of popliteal space [Baker], left knee: Secondary | ICD-10-CM

## 2023-01-05 HISTORY — PX: IR US GUIDE BX ASP/DRAIN: IMG2392

## 2023-08-23 ENCOUNTER — Encounter: Payer: Self-pay | Admitting: Internal Medicine

## 2023-08-24 ENCOUNTER — Other Ambulatory Visit: Payer: Self-pay | Admitting: Internal Medicine

## 2023-08-24 DIAGNOSIS — Z801 Family history of malignant neoplasm of trachea, bronchus and lung: Secondary | ICD-10-CM

## 2023-09-12 ENCOUNTER — Inpatient Hospital Stay
Admission: RE | Admit: 2023-09-12 | Discharge: 2023-09-12 | Discharge status: Home / Self Care | Payer: Self-pay | Source: Ambulatory Visit | Attending: Internal Medicine | Admitting: Internal Medicine

## 2023-09-12 DIAGNOSIS — Z801 Family history of malignant neoplasm of trachea, bronchus and lung: Secondary | ICD-10-CM
